# Patient Record
Sex: Male | Born: 1950 | Race: White | Hispanic: No | Marital: Single | Smoking: Former smoker
Health system: Southern US, Community
[De-identification: ages and names within clinical notes are randomized; demographics above are authoritative.]

## PROBLEM LIST (undated history)

## (undated) DIAGNOSIS — E785 Hyperlipidemia, unspecified: Secondary | ICD-10-CM

## (undated) DIAGNOSIS — M109 Gout, unspecified: Secondary | ICD-10-CM

## (undated) DIAGNOSIS — E119 Type 2 diabetes mellitus without complications: Secondary | ICD-10-CM

## (undated) HISTORY — DX: Hyperlipidemia, unspecified: E78.5

## (undated) HISTORY — DX: Gout, unspecified: M10.9

## (undated) HISTORY — DX: Type 2 diabetes mellitus without complications: E11.9

---

## 2007-07-10 ENCOUNTER — Emergency Department: Payer: Self-pay | Admitting: Emergency Medicine

## 2009-03-27 IMAGING — CR RIGHT MIDDLE FINGER 2+V
1 series · 3 of 3 positions shown · non-contrast
Comparison: none

REASON FOR EXAM: Saw injury
COMMENTS:

[Series 1: view not recorded · 0.17mm/px · 3 of 3 slices shown]
[im 1/3]
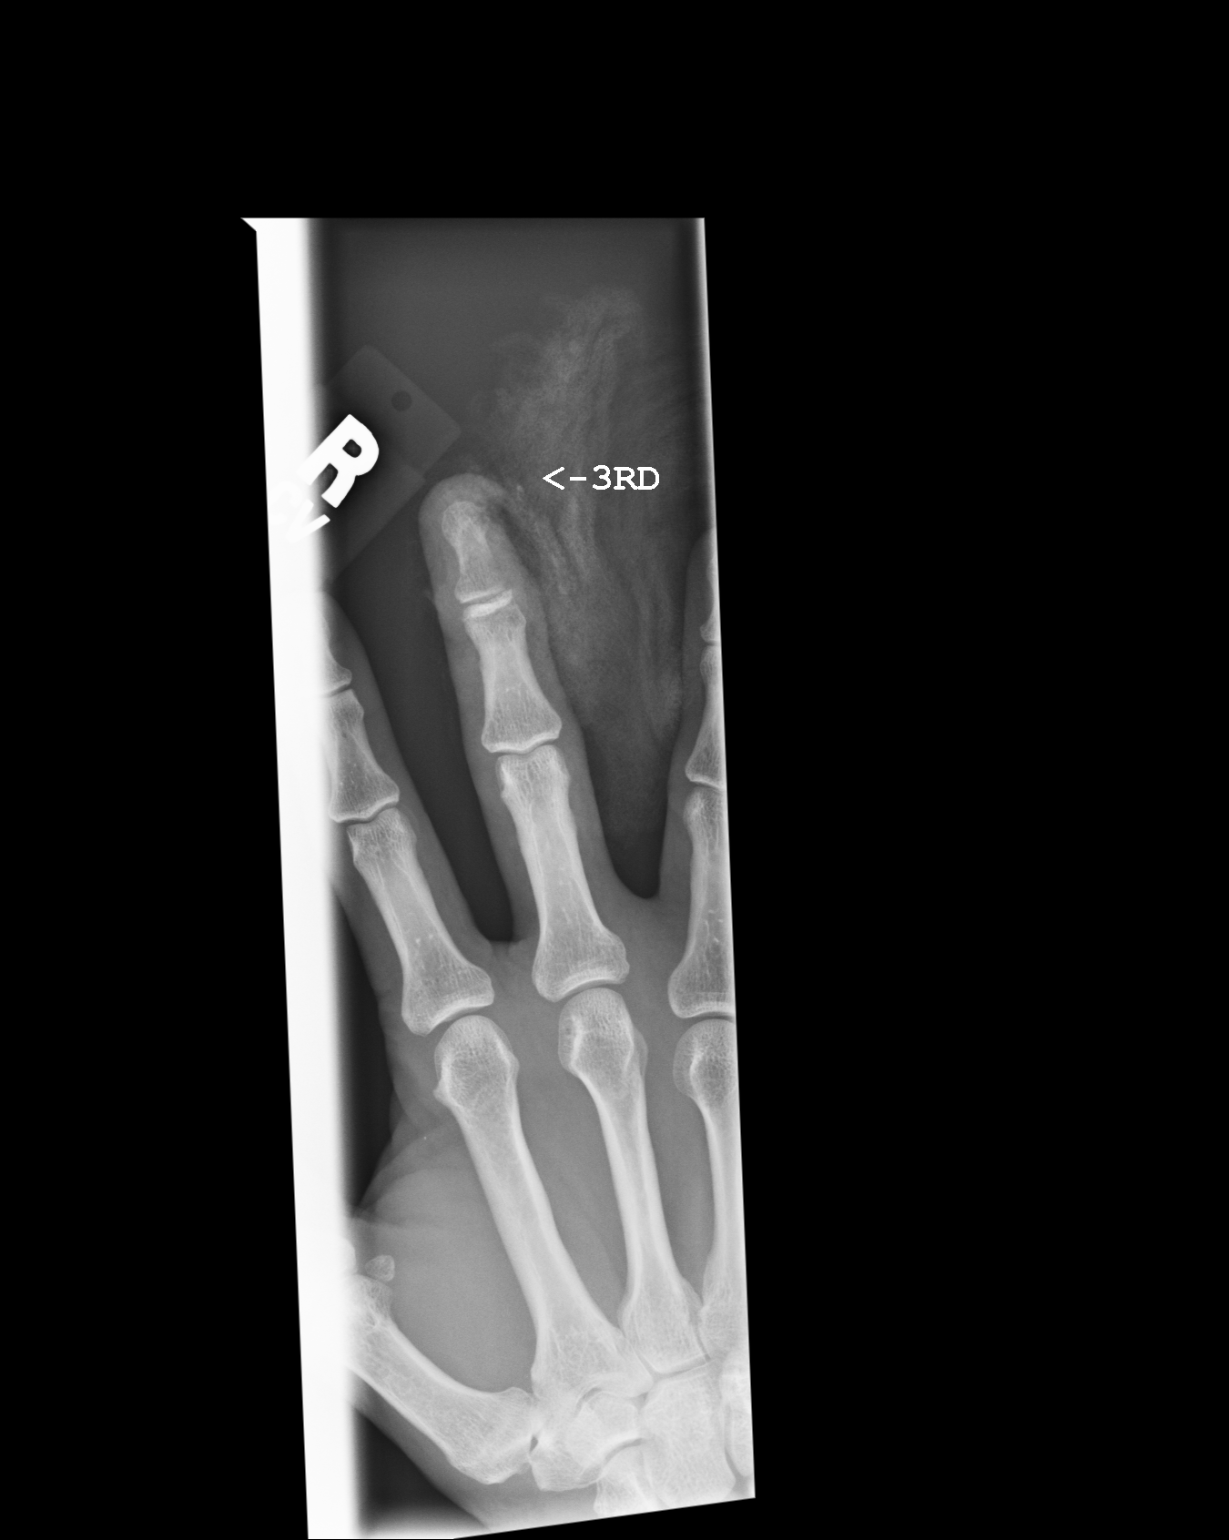
[im 2/3]
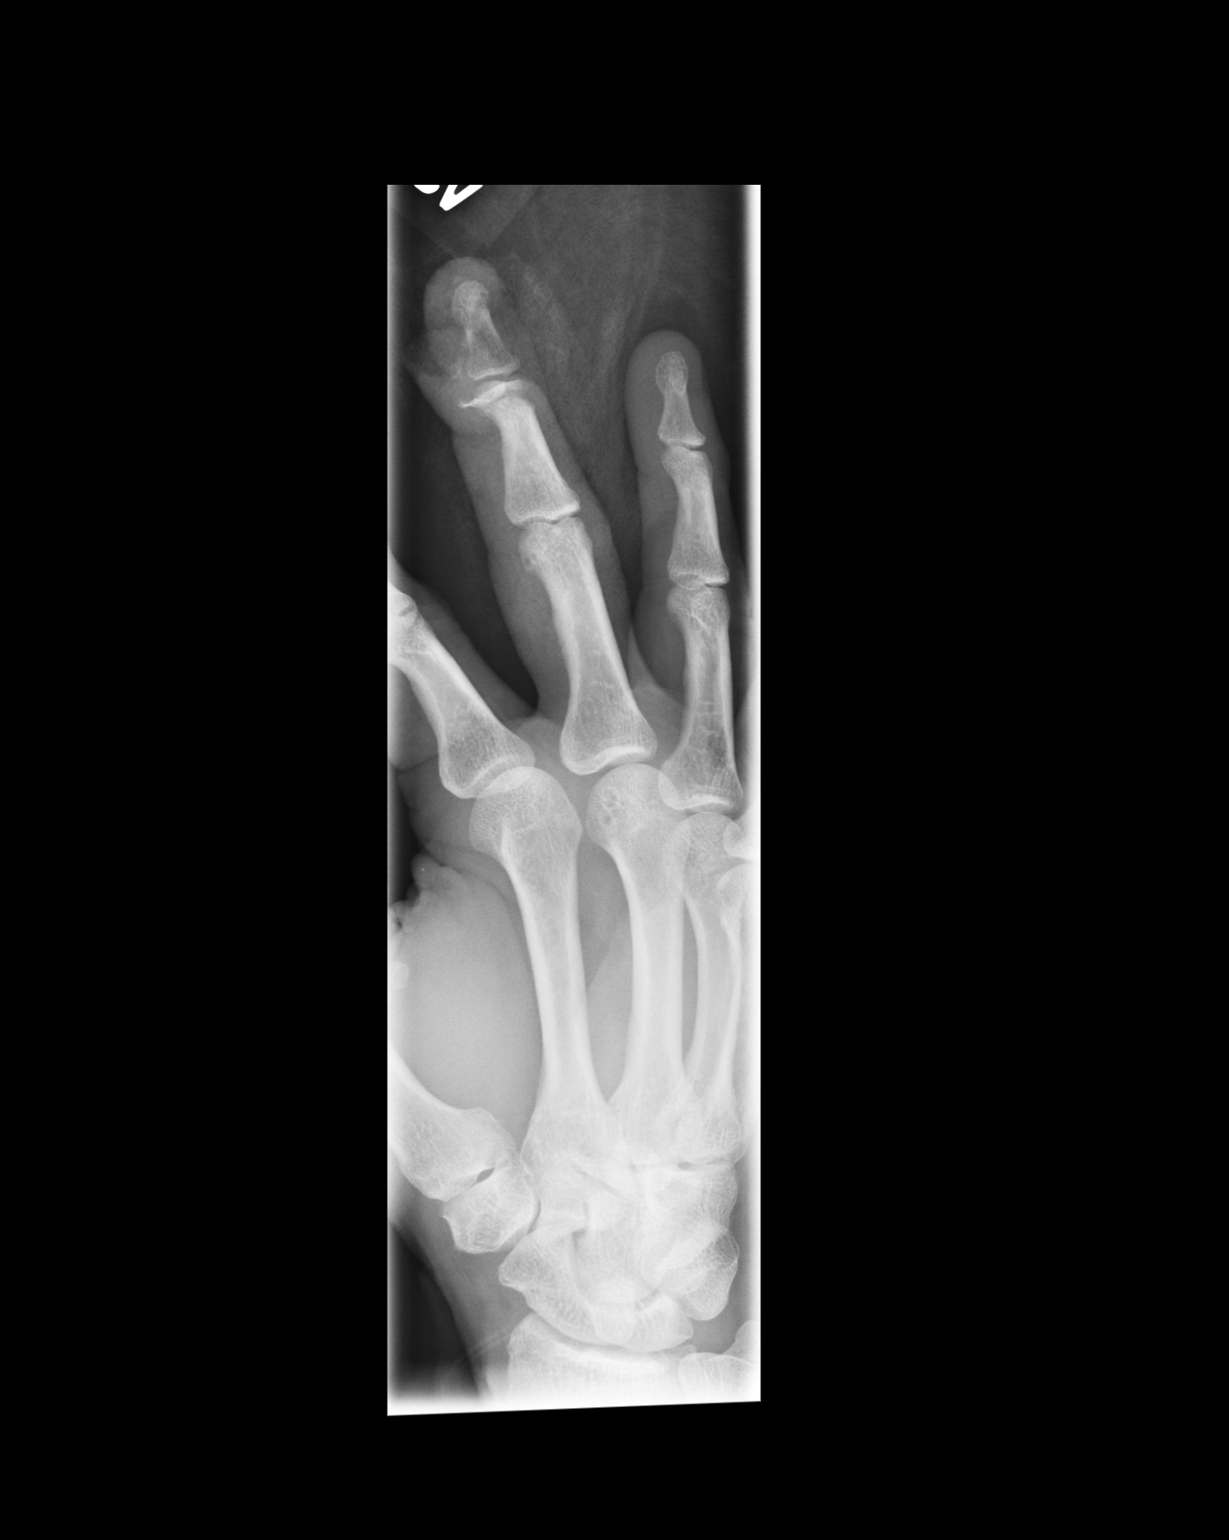
[im 3/3]
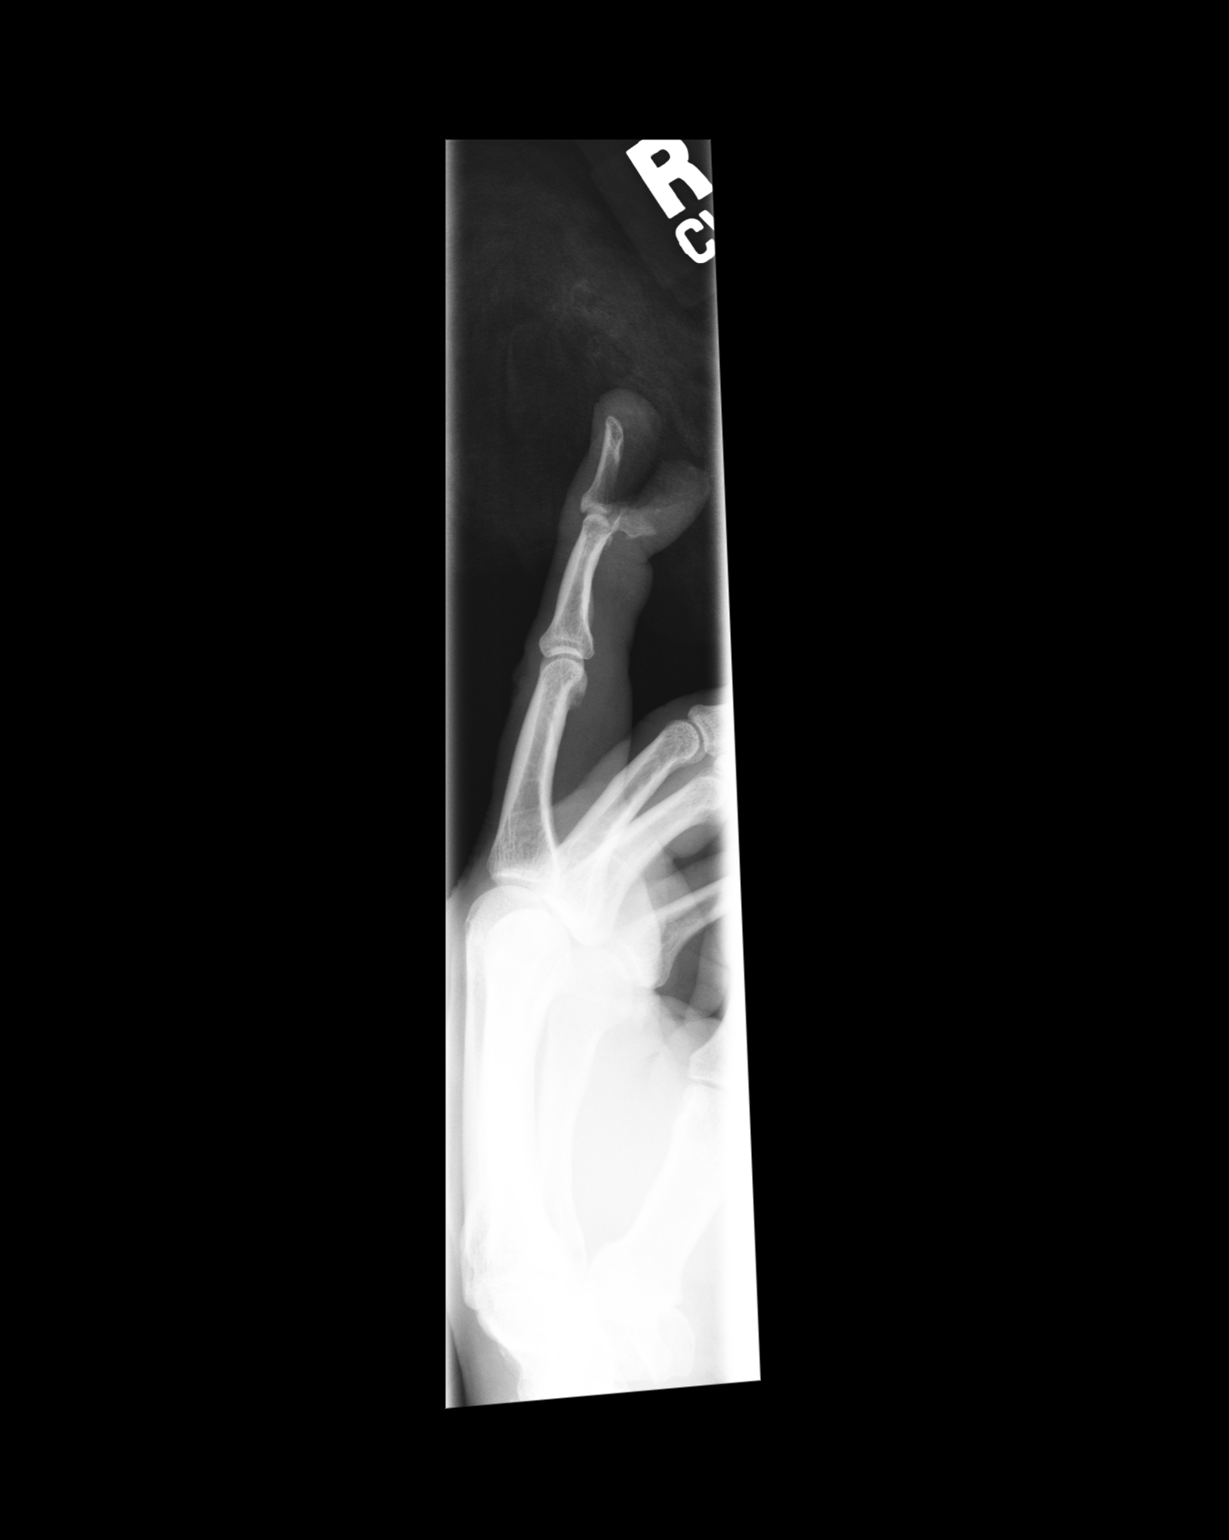

[3 of 3 positions shown; findings below may reference images not displayed]

PROCEDURE:     DXR - DXR FINGER MID 3RD DIGIT RT HAND  - July 10, 2007  [DATE]

RESULT:     Three views were obtained.

There is a laceration injury of the distal phalanx. There is what appears to
be an osseous density anterior to the DIP joint. This conceivably could
represent artifact from the associated bandage but the finding appears more
likely to represent a fracture from the distal portion of the middle phalanx.
IMPRESSION: 1.  There is noted severe soft tissue injury compatible with a laceration
injury.
2.  Probable detached fracture of the distal portion of the middle phalanx.

## 2011-12-05 ENCOUNTER — Ambulatory Visit: Payer: Self-pay | Admitting: Medical

## 2011-12-06 ENCOUNTER — Ambulatory Visit: Payer: Self-pay | Admitting: Internal Medicine

## 2013-06-11 ENCOUNTER — Ambulatory Visit: Payer: Self-pay | Admitting: Gastroenterology

## 2013-06-16 LAB — PATHOLOGY REPORT

## 2017-02-07 DIAGNOSIS — M1A09X0 Idiopathic chronic gout, multiple sites, without tophus (tophi): Secondary | ICD-10-CM | POA: Diagnosis not present

## 2017-02-07 DIAGNOSIS — R7302 Impaired glucose tolerance (oral): Secondary | ICD-10-CM | POA: Diagnosis not present

## 2017-02-07 DIAGNOSIS — Z0000 Encounter for general adult medical examination without abnormal findings: Secondary | ICD-10-CM | POA: Diagnosis not present

## 2017-02-07 DIAGNOSIS — Z1159 Encounter for screening for other viral diseases: Secondary | ICD-10-CM | POA: Diagnosis not present

## 2017-02-07 DIAGNOSIS — E782 Mixed hyperlipidemia: Secondary | ICD-10-CM | POA: Diagnosis not present

## 2017-02-07 DIAGNOSIS — R030 Elevated blood-pressure reading, without diagnosis of hypertension: Secondary | ICD-10-CM | POA: Diagnosis not present

## 2017-02-07 DIAGNOSIS — Z79899 Other long term (current) drug therapy: Secondary | ICD-10-CM | POA: Diagnosis not present

## 2017-02-07 DIAGNOSIS — Z23 Encounter for immunization: Secondary | ICD-10-CM | POA: Diagnosis not present

## 2017-05-14 DIAGNOSIS — D369 Benign neoplasm, unspecified site: Secondary | ICD-10-CM | POA: Diagnosis not present

## 2017-05-14 DIAGNOSIS — E1165 Type 2 diabetes mellitus with hyperglycemia: Secondary | ICD-10-CM | POA: Diagnosis not present

## 2017-05-14 DIAGNOSIS — R030 Elevated blood-pressure reading, without diagnosis of hypertension: Secondary | ICD-10-CM | POA: Diagnosis not present

## 2017-05-14 DIAGNOSIS — Z23 Encounter for immunization: Secondary | ICD-10-CM | POA: Diagnosis not present

## 2017-05-14 DIAGNOSIS — Z0000 Encounter for general adult medical examination without abnormal findings: Secondary | ICD-10-CM | POA: Diagnosis not present

## 2017-05-14 DIAGNOSIS — Z1211 Encounter for screening for malignant neoplasm of colon: Secondary | ICD-10-CM | POA: Diagnosis not present

## 2017-05-14 DIAGNOSIS — M1A09X0 Idiopathic chronic gout, multiple sites, without tophus (tophi): Secondary | ICD-10-CM | POA: Diagnosis not present

## 2017-05-14 DIAGNOSIS — Z125 Encounter for screening for malignant neoplasm of prostate: Secondary | ICD-10-CM | POA: Diagnosis not present

## 2017-05-29 ENCOUNTER — Encounter: Payer: Medicare HMO | Attending: Family Medicine | Admitting: *Deleted

## 2017-05-29 ENCOUNTER — Encounter: Payer: Self-pay | Admitting: *Deleted

## 2017-05-29 VITALS — BP 190/100 | Ht 72.0 in | Wt 184.3 lb

## 2017-05-29 DIAGNOSIS — Z713 Dietary counseling and surveillance: Secondary | ICD-10-CM | POA: Insufficient documentation

## 2017-05-29 DIAGNOSIS — E1165 Type 2 diabetes mellitus with hyperglycemia: Secondary | ICD-10-CM | POA: Diagnosis not present

## 2017-05-29 NOTE — Progress Notes (Signed)
Diabetes Self-Management Education  Visit Type: First/Initial  Appt. Start Time: 0900 Appt. End Time: 1000  05/29/2017  Mr. Ruben Hernandez, identified by name and date of birth, is a 67 y.o. male with a diagnosis of Diabetes: Type 2.   ASSESSMENT  Blood pressure (!) 190/100, height 6' (1.829 m), weight 184 lb 4.8 oz (83.6 kg). Body mass index is 25 kg/m.  Diabetes Self-Management Education - 05/29/17 1022      Visit Information   Visit Type  First/Initial      Initial Visit   Diabetes Type  Type 2    Are you currently following a meal plan?  No    Are you taking your medications as prescribed?  Yes    Date Diagnosed  last 12 months      Health Coping   How would you rate your overall health?  Good      Psychosocial Assessment   Patient Belief/Attitude about Diabetes  Other (comment) "it is not disturbing"    Self-care barriers  None    Self-management support  Doctor's office;Family    Patient Concerns  Nutrition/Meal planning;Glycemic Control;Medication    Special Needs  None    Preferred Learning Style  Visual    Learning Readiness  Ready    How often do you need to have someone help you when you read instructions, pamphlets, or other written materials from your doctor or pharmacy?  1 - Never    What is the last grade level you completed in school?  high school      Pre-Education Assessment   Patient understands the diabetes disease and treatment process.  Needs Instruction    Patient understands incorporating nutritional management into lifestyle.  Needs Instruction    Patient undertands incorporating physical activity into lifestyle.  Needs Instruction    Patient understands using medications safely.  Needs Instruction    Patient understands monitoring blood glucose, interpreting and using results  Needs Review    Patient understands prevention, detection, and treatment of acute complications.  Needs Instruction    Patient understands prevention, detection, and  treatment of chronic complications.  Needs Instruction    Patient understands how to develop strategies to address psychosocial issues.  Needs Instruction    Patient understands how to develop strategies to promote health/change behavior.  Needs Instruction      Complications   Last HgB A1C per patient/outside source  10.4 % 05/14/17    How often do you check your blood sugar?  0 times/day (not testing) Pt has not checked blood sugars in the last 3 weeks. BG in the office was 342 mg/dL at 9:45 am - 3 1/2 hrs after having cereal and milk for breakfast.     Have you had a dilated eye exam in the past 12 months?  Yes    Have you had a dental exam in the past 12 months?  Yes    Are you checking your feet?  No      Dietary Intake   Breakfast  cereal and milk; occasional sausage biscuit    Lunch  skips    Dinner  no red meat - eats chicken, pork, fish (grilled or baked) - occasional potatoes, corn, beans, peas, bread, pasta, cabbage, salads with lettuce, tomato, cuccumbers, peppers    Beverage(s)  water, unsweetened tea      Exercise   Exercise Type  ADL's      Patient Education   Previous Diabetes Education  No  Disease state   Definition of diabetes, type 1 and 2, and the diagnosis of diabetes;Factors that contribute to the development of diabetes    Nutrition management   Role of diet in the treatment of diabetes and the relationship between the three main macronutrients and blood glucose level;Reviewed blood glucose goals for pre and post meals and how to evaluate the patients' food intake on their blood glucose level.    Physical activity and exercise   Role of exercise on diabetes management, blood pressure control and cardiac health.    Medications  Reviewed patients medication for diabetes, action, purpose, timing of dose and side effects.    Monitoring  Purpose and frequency of SMBG.;Taught/discussed recording of test results and interpretation of SMBG.;Identified appropriate SMBG  and/or A1C goals.    Chronic complications  Relationship between chronic complications and blood glucose control;Reviewed with patient heart disease, higher risk of, and prevention    Psychosocial adjustment  Identified and addressed patients feelings and concerns about diabetes    Personal strategies to promote health  Other (comment) Instructed him to call PCP regarding BP check today. He denied any symptoms of high blood pressure.       Individualized Goals (developed by patient)   Reducing Risk Improve blood sugars Decrease medications     Outcomes   Expected Outcomes  Demonstrated interest in learning. Expect positive outcomes    Future DMSE  4-6 wks       Individualized Plan for Diabetes Self-Management Training:   Learning Objective:  Patient will have a greater understanding of diabetes self-management. Patient education plan is to attend individual and/or group sessions per assessed needs and concerns.   Plan:   Patient Instructions  Check blood sugars 2 x day before breakfast and 2 hrs after supper or another meal every day Bring blood sugar records to the next class Exercise: Begin walking for   15  minutes   3  days a week and gradually increase to 30 minutes 5 x week Eat 3 meals day,   1  snack a day Space meals 4-6 hours apart Don't skip meals   Expected Outcomes:  Demonstrated interest in learning. Expect positive outcomes  Education material provided:  General Meal Planning Guidelines Simple Meal Plan  If problems or questions, patient to contact team via:  Johny Drilling, [place], [place], CDE [phone]  Future DSME appointment: 4-6 wks  July 03, 2017 for Diabetes Class 1

## 2017-05-29 NOTE — Patient Instructions (Signed)
Check blood sugars 2 x day before breakfast and 2 hrs after supper or another meal every day Bring blood sugar records to the next class  Exercise: Begin walking for   15  minutes   3  days a week and gradually increase to 30 minutes 5 x week  Eat 3 meals day,   1  snack a day Space meals 4-6 hours apart Don't skip meals  Return for classes on:

## 2017-07-03 ENCOUNTER — Ambulatory Visit: Payer: Medicare HMO

## 2017-07-31 ENCOUNTER — Ambulatory Visit: Payer: Medicare HMO

## 2017-08-06 DIAGNOSIS — I10 Essential (primary) hypertension: Secondary | ICD-10-CM | POA: Diagnosis not present

## 2017-08-06 DIAGNOSIS — E1165 Type 2 diabetes mellitus with hyperglycemia: Secondary | ICD-10-CM | POA: Diagnosis not present

## 2017-08-06 DIAGNOSIS — Z79899 Other long term (current) drug therapy: Secondary | ICD-10-CM | POA: Diagnosis not present

## 2017-08-06 DIAGNOSIS — E782 Mixed hyperlipidemia: Secondary | ICD-10-CM | POA: Diagnosis not present

## 2017-08-15 ENCOUNTER — Encounter: Payer: Self-pay | Admitting: *Deleted

## 2017-08-15 DIAGNOSIS — E1165 Type 2 diabetes mellitus with hyperglycemia: Secondary | ICD-10-CM | POA: Diagnosis not present

## 2017-08-15 DIAGNOSIS — I10 Essential (primary) hypertension: Secondary | ICD-10-CM | POA: Diagnosis not present

## 2017-08-15 DIAGNOSIS — Z79899 Other long term (current) drug therapy: Secondary | ICD-10-CM | POA: Diagnosis not present

## 2017-08-15 DIAGNOSIS — E782 Mixed hyperlipidemia: Secondary | ICD-10-CM | POA: Diagnosis not present

## 2017-09-12 DIAGNOSIS — E119 Type 2 diabetes mellitus without complications: Secondary | ICD-10-CM | POA: Diagnosis not present

## 2017-09-26 DIAGNOSIS — I10 Essential (primary) hypertension: Secondary | ICD-10-CM | POA: Diagnosis not present

## 2017-09-26 DIAGNOSIS — E782 Mixed hyperlipidemia: Secondary | ICD-10-CM | POA: Diagnosis not present

## 2017-09-26 DIAGNOSIS — E119 Type 2 diabetes mellitus without complications: Secondary | ICD-10-CM | POA: Diagnosis not present

## 2017-12-05 DIAGNOSIS — E782 Mixed hyperlipidemia: Secondary | ICD-10-CM | POA: Diagnosis not present

## 2017-12-05 DIAGNOSIS — E1165 Type 2 diabetes mellitus with hyperglycemia: Secondary | ICD-10-CM | POA: Diagnosis not present

## 2017-12-05 DIAGNOSIS — I10 Essential (primary) hypertension: Secondary | ICD-10-CM | POA: Diagnosis not present

## 2018-06-12 DIAGNOSIS — E782 Mixed hyperlipidemia: Secondary | ICD-10-CM | POA: Diagnosis not present

## 2018-06-12 DIAGNOSIS — E1165 Type 2 diabetes mellitus with hyperglycemia: Secondary | ICD-10-CM | POA: Diagnosis not present

## 2018-06-12 DIAGNOSIS — E1159 Type 2 diabetes mellitus with other circulatory complications: Secondary | ICD-10-CM | POA: Diagnosis not present

## 2018-06-12 DIAGNOSIS — I10 Essential (primary) hypertension: Secondary | ICD-10-CM | POA: Diagnosis not present

## 2018-06-12 DIAGNOSIS — E119 Type 2 diabetes mellitus without complications: Secondary | ICD-10-CM | POA: Diagnosis not present

## 2018-07-14 DIAGNOSIS — E1165 Type 2 diabetes mellitus with hyperglycemia: Secondary | ICD-10-CM | POA: Diagnosis not present

## 2018-07-14 DIAGNOSIS — I10 Essential (primary) hypertension: Secondary | ICD-10-CM | POA: Diagnosis not present

## 2018-07-14 DIAGNOSIS — M1A09X0 Idiopathic chronic gout, multiple sites, without tophus (tophi): Secondary | ICD-10-CM | POA: Diagnosis not present

## 2018-07-14 DIAGNOSIS — E559 Vitamin D deficiency, unspecified: Secondary | ICD-10-CM | POA: Diagnosis not present

## 2018-07-14 DIAGNOSIS — E538 Deficiency of other specified B group vitamins: Secondary | ICD-10-CM | POA: Diagnosis not present

## 2018-07-14 DIAGNOSIS — D369 Benign neoplasm, unspecified site: Secondary | ICD-10-CM | POA: Diagnosis not present

## 2018-07-14 DIAGNOSIS — E782 Mixed hyperlipidemia: Secondary | ICD-10-CM | POA: Diagnosis not present

## 2018-12-22 DIAGNOSIS — M25571 Pain in right ankle and joints of right foot: Secondary | ICD-10-CM | POA: Diagnosis not present

## 2018-12-22 DIAGNOSIS — M25471 Effusion, right ankle: Secondary | ICD-10-CM | POA: Diagnosis not present

## 2019-05-26 DIAGNOSIS — Z79899 Other long term (current) drug therapy: Secondary | ICD-10-CM | POA: Diagnosis not present

## 2019-05-26 DIAGNOSIS — M1A09X0 Idiopathic chronic gout, multiple sites, without tophus (tophi): Secondary | ICD-10-CM | POA: Diagnosis not present

## 2019-05-26 DIAGNOSIS — Z1329 Encounter for screening for other suspected endocrine disorder: Secondary | ICD-10-CM | POA: Diagnosis not present

## 2019-05-26 DIAGNOSIS — E538 Deficiency of other specified B group vitamins: Secondary | ICD-10-CM | POA: Diagnosis not present

## 2019-05-26 DIAGNOSIS — E1165 Type 2 diabetes mellitus with hyperglycemia: Secondary | ICD-10-CM | POA: Diagnosis not present

## 2019-05-26 DIAGNOSIS — Z0000 Encounter for general adult medical examination without abnormal findings: Secondary | ICD-10-CM | POA: Diagnosis not present

## 2019-05-26 DIAGNOSIS — E782 Mixed hyperlipidemia: Secondary | ICD-10-CM | POA: Diagnosis not present

## 2019-05-26 DIAGNOSIS — I10 Essential (primary) hypertension: Secondary | ICD-10-CM | POA: Diagnosis not present

## 2019-05-26 DIAGNOSIS — Z125 Encounter for screening for malignant neoplasm of prostate: Secondary | ICD-10-CM | POA: Diagnosis not present

## 2019-05-26 DIAGNOSIS — Z7189 Other specified counseling: Secondary | ICD-10-CM | POA: Diagnosis not present

## 2019-05-26 DIAGNOSIS — D369 Benign neoplasm, unspecified site: Secondary | ICD-10-CM | POA: Diagnosis not present

## 2019-05-26 DIAGNOSIS — E559 Vitamin D deficiency, unspecified: Secondary | ICD-10-CM | POA: Diagnosis not present

## 2019-06-15 DIAGNOSIS — I10 Essential (primary) hypertension: Secondary | ICD-10-CM | POA: Diagnosis not present

## 2019-06-15 DIAGNOSIS — E538 Deficiency of other specified B group vitamins: Secondary | ICD-10-CM | POA: Diagnosis not present

## 2019-06-15 DIAGNOSIS — E119 Type 2 diabetes mellitus without complications: Secondary | ICD-10-CM | POA: Diagnosis not present

## 2019-06-15 DIAGNOSIS — E1159 Type 2 diabetes mellitus with other circulatory complications: Secondary | ICD-10-CM | POA: Diagnosis not present

## 2019-06-15 DIAGNOSIS — E782 Mixed hyperlipidemia: Secondary | ICD-10-CM | POA: Diagnosis not present

## 2019-06-15 DIAGNOSIS — E559 Vitamin D deficiency, unspecified: Secondary | ICD-10-CM | POA: Diagnosis not present

## 2019-11-23 DIAGNOSIS — E1165 Type 2 diabetes mellitus with hyperglycemia: Secondary | ICD-10-CM | POA: Diagnosis not present

## 2019-11-23 DIAGNOSIS — E559 Vitamin D deficiency, unspecified: Secondary | ICD-10-CM | POA: Diagnosis not present

## 2019-11-23 DIAGNOSIS — I499 Cardiac arrhythmia, unspecified: Secondary | ICD-10-CM | POA: Diagnosis not present

## 2019-11-23 DIAGNOSIS — I10 Essential (primary) hypertension: Secondary | ICD-10-CM | POA: Diagnosis not present

## 2019-12-27 DIAGNOSIS — E119 Type 2 diabetes mellitus without complications: Secondary | ICD-10-CM | POA: Diagnosis not present

## 2020-06-01 DIAGNOSIS — M1A09X0 Idiopathic chronic gout, multiple sites, without tophus (tophi): Secondary | ICD-10-CM | POA: Diagnosis not present

## 2020-06-01 DIAGNOSIS — I10 Essential (primary) hypertension: Secondary | ICD-10-CM | POA: Diagnosis not present

## 2020-06-01 DIAGNOSIS — E782 Mixed hyperlipidemia: Secondary | ICD-10-CM | POA: Diagnosis not present

## 2020-06-01 DIAGNOSIS — Z1329 Encounter for screening for other suspected endocrine disorder: Secondary | ICD-10-CM | POA: Diagnosis not present

## 2020-06-01 DIAGNOSIS — D369 Benign neoplasm, unspecified site: Secondary | ICD-10-CM | POA: Diagnosis not present

## 2020-06-01 DIAGNOSIS — E559 Vitamin D deficiency, unspecified: Secondary | ICD-10-CM | POA: Diagnosis not present

## 2020-06-01 DIAGNOSIS — E538 Deficiency of other specified B group vitamins: Secondary | ICD-10-CM | POA: Diagnosis not present

## 2020-06-01 DIAGNOSIS — E1165 Type 2 diabetes mellitus with hyperglycemia: Secondary | ICD-10-CM | POA: Diagnosis not present

## 2020-06-01 DIAGNOSIS — Z125 Encounter for screening for malignant neoplasm of prostate: Secondary | ICD-10-CM | POA: Diagnosis not present

## 2020-06-01 DIAGNOSIS — Z0000 Encounter for general adult medical examination without abnormal findings: Secondary | ICD-10-CM | POA: Diagnosis not present

## 2020-06-13 DIAGNOSIS — E1159 Type 2 diabetes mellitus with other circulatory complications: Secondary | ICD-10-CM | POA: Diagnosis not present

## 2020-06-13 DIAGNOSIS — E119 Type 2 diabetes mellitus without complications: Secondary | ICD-10-CM | POA: Diagnosis not present

## 2020-06-13 DIAGNOSIS — I152 Hypertension secondary to endocrine disorders: Secondary | ICD-10-CM | POA: Diagnosis not present

## 2020-06-13 DIAGNOSIS — E782 Mixed hyperlipidemia: Secondary | ICD-10-CM | POA: Diagnosis not present

## 2020-06-13 DIAGNOSIS — I10 Essential (primary) hypertension: Secondary | ICD-10-CM | POA: Diagnosis not present

## 2021-02-06 DIAGNOSIS — Z0100 Encounter for examination of eyes and vision without abnormal findings: Secondary | ICD-10-CM | POA: Diagnosis not present

## 2021-02-06 DIAGNOSIS — E119 Type 2 diabetes mellitus without complications: Secondary | ICD-10-CM | POA: Diagnosis not present

## 2021-06-19 DIAGNOSIS — Z1321 Encounter for screening for nutritional disorder: Secondary | ICD-10-CM | POA: Diagnosis not present

## 2021-06-19 DIAGNOSIS — Z1329 Encounter for screening for other suspected endocrine disorder: Secondary | ICD-10-CM | POA: Diagnosis not present

## 2021-06-19 DIAGNOSIS — Z0000 Encounter for general adult medical examination without abnormal findings: Secondary | ICD-10-CM | POA: Diagnosis not present

## 2021-06-19 DIAGNOSIS — E782 Mixed hyperlipidemia: Secondary | ICD-10-CM | POA: Diagnosis not present

## 2021-06-19 DIAGNOSIS — Z23 Encounter for immunization: Secondary | ICD-10-CM | POA: Diagnosis not present

## 2021-06-19 DIAGNOSIS — Z125 Encounter for screening for malignant neoplasm of prostate: Secondary | ICD-10-CM | POA: Diagnosis not present

## 2021-06-19 DIAGNOSIS — E1165 Type 2 diabetes mellitus with hyperglycemia: Secondary | ICD-10-CM | POA: Diagnosis not present

## 2021-06-19 DIAGNOSIS — E559 Vitamin D deficiency, unspecified: Secondary | ICD-10-CM | POA: Diagnosis not present

## 2021-06-19 DIAGNOSIS — D369 Benign neoplasm, unspecified site: Secondary | ICD-10-CM | POA: Diagnosis not present

## 2021-06-19 DIAGNOSIS — M1A09X0 Idiopathic chronic gout, multiple sites, without tophus (tophi): Secondary | ICD-10-CM | POA: Diagnosis not present

## 2021-06-19 DIAGNOSIS — I10 Essential (primary) hypertension: Secondary | ICD-10-CM | POA: Diagnosis not present

## 2021-06-19 DIAGNOSIS — E538 Deficiency of other specified B group vitamins: Secondary | ICD-10-CM | POA: Diagnosis not present

## 2021-07-10 DIAGNOSIS — Z789 Other specified health status: Secondary | ICD-10-CM | POA: Diagnosis not present

## 2021-07-10 DIAGNOSIS — E119 Type 2 diabetes mellitus without complications: Secondary | ICD-10-CM | POA: Diagnosis not present

## 2021-07-10 DIAGNOSIS — M791 Myalgia, unspecified site: Secondary | ICD-10-CM | POA: Diagnosis not present

## 2021-12-17 DIAGNOSIS — E559 Vitamin D deficiency, unspecified: Secondary | ICD-10-CM | POA: Diagnosis not present

## 2021-12-17 DIAGNOSIS — Z09 Encounter for follow-up examination after completed treatment for conditions other than malignant neoplasm: Secondary | ICD-10-CM | POA: Diagnosis not present

## 2021-12-17 DIAGNOSIS — Z23 Encounter for immunization: Secondary | ICD-10-CM | POA: Diagnosis not present

## 2021-12-17 DIAGNOSIS — E538 Deficiency of other specified B group vitamins: Secondary | ICD-10-CM | POA: Diagnosis not present

## 2021-12-17 DIAGNOSIS — E1165 Type 2 diabetes mellitus with hyperglycemia: Secondary | ICD-10-CM | POA: Diagnosis not present

## 2021-12-17 DIAGNOSIS — E782 Mixed hyperlipidemia: Secondary | ICD-10-CM | POA: Diagnosis not present

## 2021-12-17 DIAGNOSIS — I119 Hypertensive heart disease without heart failure: Secondary | ICD-10-CM | POA: Diagnosis not present

## 2021-12-17 DIAGNOSIS — G720 Drug-induced myopathy: Secondary | ICD-10-CM | POA: Diagnosis not present

## 2022-02-08 DIAGNOSIS — E119 Type 2 diabetes mellitus without complications: Secondary | ICD-10-CM | POA: Diagnosis not present

## 2022-12-18 DIAGNOSIS — Z125 Encounter for screening for malignant neoplasm of prostate: Secondary | ICD-10-CM | POA: Diagnosis not present

## 2022-12-18 DIAGNOSIS — E119 Type 2 diabetes mellitus without complications: Secondary | ICD-10-CM | POA: Diagnosis not present

## 2022-12-18 DIAGNOSIS — E782 Mixed hyperlipidemia: Secondary | ICD-10-CM | POA: Diagnosis not present

## 2022-12-18 DIAGNOSIS — I119 Hypertensive heart disease without heart failure: Secondary | ICD-10-CM | POA: Diagnosis not present

## 2022-12-18 DIAGNOSIS — Z0000 Encounter for general adult medical examination without abnormal findings: Secondary | ICD-10-CM | POA: Diagnosis not present

## 2022-12-18 DIAGNOSIS — E1165 Type 2 diabetes mellitus with hyperglycemia: Secondary | ICD-10-CM | POA: Diagnosis not present

## 2022-12-18 DIAGNOSIS — D369 Benign neoplasm, unspecified site: Secondary | ICD-10-CM | POA: Diagnosis not present

## 2022-12-18 DIAGNOSIS — E559 Vitamin D deficiency, unspecified: Secondary | ICD-10-CM | POA: Diagnosis not present

## 2022-12-18 DIAGNOSIS — I10 Essential (primary) hypertension: Secondary | ICD-10-CM | POA: Diagnosis not present

## 2022-12-18 DIAGNOSIS — Z1331 Encounter for screening for depression: Secondary | ICD-10-CM | POA: Diagnosis not present

## 2022-12-18 DIAGNOSIS — G720 Drug-induced myopathy: Secondary | ICD-10-CM | POA: Diagnosis not present

## 2022-12-18 DIAGNOSIS — E538 Deficiency of other specified B group vitamins: Secondary | ICD-10-CM | POA: Diagnosis not present

## 2022-12-18 DIAGNOSIS — T466X5A Adverse effect of antihyperlipidemic and antiarteriosclerotic drugs, initial encounter: Secondary | ICD-10-CM | POA: Diagnosis not present

## 2022-12-18 DIAGNOSIS — M1A09X0 Idiopathic chronic gout, multiple sites, without tophus (tophi): Secondary | ICD-10-CM | POA: Diagnosis not present

## 2023-01-10 DIAGNOSIS — Z136 Encounter for screening for cardiovascular disorders: Secondary | ICD-10-CM | POA: Diagnosis not present

## 2023-02-10 DIAGNOSIS — H5203 Hypermetropia, bilateral: Secondary | ICD-10-CM | POA: Diagnosis not present

## 2023-02-10 DIAGNOSIS — H524 Presbyopia: Secondary | ICD-10-CM | POA: Diagnosis not present

## 2023-02-10 DIAGNOSIS — H2513 Age-related nuclear cataract, bilateral: Secondary | ICD-10-CM | POA: Diagnosis not present

## 2023-02-10 DIAGNOSIS — E119 Type 2 diabetes mellitus without complications: Secondary | ICD-10-CM | POA: Diagnosis not present

## 2023-07-02 DIAGNOSIS — Z09 Encounter for follow-up examination after completed treatment for conditions other than malignant neoplasm: Secondary | ICD-10-CM | POA: Diagnosis not present

## 2023-07-02 DIAGNOSIS — E538 Deficiency of other specified B group vitamins: Secondary | ICD-10-CM | POA: Diagnosis not present

## 2023-07-02 DIAGNOSIS — E119 Type 2 diabetes mellitus without complications: Secondary | ICD-10-CM | POA: Diagnosis not present

## 2023-07-02 DIAGNOSIS — M1A09X0 Idiopathic chronic gout, multiple sites, without tophus (tophi): Secondary | ICD-10-CM | POA: Diagnosis not present

## 2023-07-02 DIAGNOSIS — E782 Mixed hyperlipidemia: Secondary | ICD-10-CM | POA: Diagnosis not present

## 2023-07-02 DIAGNOSIS — I10 Essential (primary) hypertension: Secondary | ICD-10-CM | POA: Diagnosis not present

## 2024-01-02 DIAGNOSIS — I10 Essential (primary) hypertension: Secondary | ICD-10-CM | POA: Diagnosis not present

## 2024-01-02 DIAGNOSIS — Z1329 Encounter for screening for other suspected endocrine disorder: Secondary | ICD-10-CM | POA: Diagnosis not present

## 2024-01-02 DIAGNOSIS — E782 Mixed hyperlipidemia: Secondary | ICD-10-CM | POA: Diagnosis not present

## 2024-01-02 DIAGNOSIS — I1310 Hypertensive heart and chronic kidney disease without heart failure, with stage 1 through stage 4 chronic kidney disease, or unspecified chronic kidney disease: Secondary | ICD-10-CM | POA: Diagnosis not present

## 2024-01-02 DIAGNOSIS — Z0000 Encounter for general adult medical examination without abnormal findings: Secondary | ICD-10-CM | POA: Diagnosis not present

## 2024-01-02 DIAGNOSIS — Z1331 Encounter for screening for depression: Secondary | ICD-10-CM | POA: Diagnosis not present

## 2024-01-02 DIAGNOSIS — Z125 Encounter for screening for malignant neoplasm of prostate: Secondary | ICD-10-CM | POA: Diagnosis not present

## 2024-01-02 DIAGNOSIS — E538 Deficiency of other specified B group vitamins: Secondary | ICD-10-CM | POA: Diagnosis not present

## 2024-01-02 DIAGNOSIS — G720 Drug-induced myopathy: Secondary | ICD-10-CM | POA: Diagnosis not present

## 2024-01-02 DIAGNOSIS — E559 Vitamin D deficiency, unspecified: Secondary | ICD-10-CM | POA: Diagnosis not present

## 2024-01-02 DIAGNOSIS — E1122 Type 2 diabetes mellitus with diabetic chronic kidney disease: Secondary | ICD-10-CM | POA: Diagnosis not present

## 2024-01-02 DIAGNOSIS — M1A09X0 Idiopathic chronic gout, multiple sites, without tophus (tophi): Secondary | ICD-10-CM | POA: Diagnosis not present

## 2024-01-02 DIAGNOSIS — N1831 Chronic kidney disease, stage 3a: Secondary | ICD-10-CM | POA: Diagnosis not present

## 2024-01-02 DIAGNOSIS — E1165 Type 2 diabetes mellitus with hyperglycemia: Secondary | ICD-10-CM | POA: Diagnosis not present

## 2024-01-07 DIAGNOSIS — E1151 Type 2 diabetes mellitus with diabetic peripheral angiopathy without gangrene: Secondary | ICD-10-CM | POA: Diagnosis not present

## 2024-01-07 DIAGNOSIS — Q828 Other specified congenital malformations of skin: Secondary | ICD-10-CM | POA: Diagnosis not present

## 2024-01-07 DIAGNOSIS — I872 Venous insufficiency (chronic) (peripheral): Secondary | ICD-10-CM | POA: Diagnosis not present

## 2024-01-07 DIAGNOSIS — M79672 Pain in left foot: Secondary | ICD-10-CM | POA: Diagnosis not present

## 2024-02-11 DIAGNOSIS — H2513 Age-related nuclear cataract, bilateral: Secondary | ICD-10-CM | POA: Diagnosis not present

## 2024-02-11 DIAGNOSIS — H5203 Hypermetropia, bilateral: Secondary | ICD-10-CM | POA: Diagnosis not present

## 2024-02-11 DIAGNOSIS — E119 Type 2 diabetes mellitus without complications: Secondary | ICD-10-CM | POA: Diagnosis not present

## 2024-02-11 DIAGNOSIS — H524 Presbyopia: Secondary | ICD-10-CM | POA: Diagnosis not present

## 2024-04-23 NOTE — Progress Notes (Addendum)
 Ruben Hernandez                                          MRN: [id number]   04/23/2024   The [facility] Quality Team Specialist reviewed this patient medical record for the purposes of chart review for care gap closure. The following were reviewed: chart review for care gap closure-controlling blood pressure. Chart reviewed for COL screening as well  ABSTRACTED GSD  05/25/2024- CANNOT CLOSE CBP 2025    [facility] Quality Team
